# Patient Record
Sex: Female | Born: 1970 | Race: White | Marital: Married
Health system: Northeastern US, Community
[De-identification: ages and names within clinical notes are randomized; demographics above are authoritative.]

---

## 2004-04-09 ENCOUNTER — Ambulatory Visit (HOSPITAL_BASED_OUTPATIENT_CLINIC_OR_DEPARTMENT_OTHER): Payer: MEDICAID

## 2004-04-09 VITALS — BP 120/70 | HR 68 | Temp 97.1°F | Resp 14 | Ht 66.0 in | Wt 176.0 lb

## 2004-04-09 DIAGNOSIS — R51 Headache: Secondary | ICD-10-CM

## 2004-04-09 DIAGNOSIS — R5383 Other fatigue: Secondary | ICD-10-CM

## 2004-04-09 DIAGNOSIS — R5381 Other malaise: Secondary | ICD-10-CM

## 2004-04-09 LAB — COMPREHENSIVE METABOLIC PANEL
ALANINE AMINOTRANSFERASE: 18 IU/L (ref 7–35)
ALBUMIN: 4.1 g/dl (ref 3.4–4.8)
ALKALINE PHOSPHATASE: 53 IU/L (ref 25–106)
ASPARTATE AMINOTRANSFERASE: 22 IU/L (ref 8–34)
BILIRUBIN TOTAL: 0.3 mg/dl (ref 0.2–1.1)
BUN (UREA NITROGEN): 12 mg/dl (ref 6–20)
CALCIUM: 9.4 mg/dl (ref 8.6–10.0)
CARBON DIOXIDE: 29 mmol/L (ref 22–32)
CHLORIDE: 104 mmol/L (ref 101–111)
CREATININE: 0.8 mg/dl (ref 0.4–1.2)
Glucose Random: 90 mg/dl (ref 74–160)
POTASSIUM: 4 mmol/L (ref 3.5–5.1)
SODIUM: 138 mmol/L (ref 135–144)
TOTAL PROTEIN: 6.7 g/dl (ref 5.9–7.5)

## 2004-04-09 LAB — BLOOD COUNT COMPLETE AUTO&AUTO DIFRNTL WBC
BASOPHIL %: 0.4 % (ref 0–2)
EOSINOPHIL %: 10.1 % (ref 0–7)
HEMATOCRIT: 38.2 % (ref 37.0–47.0)
HEMOGLOBIN: 12.6 g/dL (ref 12.0–16.0)
LYMPHOCYTE %: 33.7 % (ref 12–39)
MEAN CORP HGB CONC: 33.1 g/dL (ref 32.0–36.0)
MEAN CORPUSCULAR HGB: 29.1 pg (ref 27.0–31.0)
MEAN CORPUSCULAR VOL: 87.9 fL (ref 81.0–99.0)
MEAN PLATELET VOLUME: 7.4 fL (ref 6.4–10.8)
MONOCYTE %: 8.4 % (ref 1–12)
NEUTROPHIL %: 47.4 % (ref 46–79)
PLATELET COUNT: 415 10*3/uL — ABNORMAL HIGH (ref 150–400)
RBC DISTRIBUTION WIDTH: 13.2 % (ref 11.5–14.3)
RED BLOOD CELL COUNT: 4.34 MIL/uL (ref 4.20–5.40)
WHITE BLOOD CELL COUNT: 9.2 10*3/uL (ref 4.8–10.8)

## 2004-04-09 LAB — TSH (THYROID STIMULATING HORMONE): TSH (THYROID STIM HORMONE): 1.05 u[IU]/mL (ref 0.34–5.60)

## 2004-04-09 NOTE — Progress Notes (Signed)
SUBJECTIVE:  Alexandra Hanson is a 34 year old female who complains of headaches for 4 WEEKS Description of pain: sharp pain. Duration of individual headaches: , frequency rare. Associated symptoms: no nausea, emesis, photophobia or aura. Pain relief: unable to obtain relief with OTC meds. Precipitating factors:LACK OF GLASSES. She denies a history of recent head injury.   Prior neurological history: negative for migraine headaches.  Neurologic Review of Systems - no TIA or stroke-like symptoms.    No current outpatient prescriptions on file.      OBJECTIVE:  Appearance: healthy, alert and cooperative.  Neurological Exam: normal without focal findings, mental status, speech normal, alert and oriented x iii, PERLA, reflexes normal and symmetric.    ASSESSMENT:  unlikely to have organic CNS lesion from H&P.    PLAN:  784.0 HEADACHE (primary encounter diagnosi  Plan: REFERRAL TO OPHTHALMOLOGY (INT)       780.79 MALAISE AND FATIGUE N  Plan: COMPLETE CBC W/AUTO DIFF WBC, COMPREHENSIVE    METABOLIC PANEL, THYROID STIM HORMONE

## 2004-04-15 ENCOUNTER — Ambulatory Visit (HOSPITAL_BASED_OUTPATIENT_CLINIC_OR_DEPARTMENT_OTHER): Payer: Self-pay | Admitting: Ophthalmology

## 2005-02-25 ENCOUNTER — Ambulatory Visit (HOSPITAL_BASED_OUTPATIENT_CLINIC_OR_DEPARTMENT_OTHER): Payer: Medicaid Other

## 2005-02-25 VITALS — BP 100/70 | Temp 98.5°F | Wt 165.0 lb

## 2005-02-25 DIAGNOSIS — J019 Acute sinusitis, unspecified: Secondary | ICD-10-CM

## 2005-02-25 DIAGNOSIS — M069 Rheumatoid arthritis, unspecified: Secondary | ICD-10-CM | POA: Insufficient documentation

## 2005-02-25 DIAGNOSIS — N979 Female infertility, unspecified: Secondary | ICD-10-CM

## 2005-02-25 LAB — URINALYSIS
BILIRUBIN, URINE: NEGATIVE
GLUCOSE, URINE: NEGATIVE MG/DL
KETONE, URINE: NEGATIVE MG/DL
LEUKOCYTE ESTERASE: NEGATIVE
NITRITE, URINE: NEGATIVE
OCCULT BLOOD, URINE: NEGATIVE
PH URINE: 6 (ref 5.0–8.0)
PROTEIN, URINE: NEGATIVE MG/DL
SPECIFIC GRAVITY URINE: 1.02 (ref 1.003–1.035)

## 2005-02-25 LAB — BLOOD COUNT COMPLETE AUTO&AUTO DIFRNTL WBC
BASOPHIL %: 0.3 % (ref 0–2)
EOSINOPHIL %: 5.2 % (ref 0–7)
HEMATOCRIT: 37 % (ref 37.0–47.0)
HEMOGLOBIN: 12.8 g/dL (ref 12.0–16.0)
LYMPHOCYTE %: 30.2 % (ref 12–39)
MEAN CORP HGB CONC: 34.7 g/dL (ref 32.0–36.0)
MEAN CORPUSCULAR HGB: 30.2 pg (ref 27.0–31.0)
MEAN CORPUSCULAR VOL: 87.1 fL (ref 81.0–99.0)
MEAN PLATELET VOLUME: 7.5 fL (ref 6.4–10.8)
MONOCYTE %: 7.2 % (ref 1–12)
NEUTROPHIL %: 57.1 % (ref 46–79)
PLATELET COUNT: 391 10*3/uL (ref 150–400)
RBC DISTRIBUTION WIDTH: 13.4 % (ref 11.5–14.3)
RED BLOOD CELL COUNT: 4.24 MIL/uL (ref 4.20–5.40)
WHITE BLOOD CELL COUNT: 9.4 10*3/uL (ref 4.8–10.8)

## 2005-02-25 LAB — CHG SEDIMENTATION RATE RBC NON-AUTOMATED: RBC SEDIMENTATION RATE: 7 MM/HR (ref 0–15)

## 2005-02-25 MED ORDER — BACTRIM DS 800-160 MG PO TABS
ORAL_TABLET | ORAL | Status: AC
Start: 2005-02-25 — End: 2005-02-28

## 2005-02-25 MED ORDER — FLONASE 50 MCG/DOSE NA INHA
NASAL | Status: AC
Start: 2005-02-25 — End: 2005-03-28

## 2005-02-25 NOTE — Progress Notes (Signed)
SUBJECTIVE:  Alexandra Hanson is a 35 year old female who complains of headaches for 1 months. Description of pain: throbbing pain bilateral in maxillary area. Duration of individual headaches: frequency intermittent. Associated symptoms: no nausea, emesis, photophobia or aura. Pain relief: acetaminophen. Precipitating factors: patient is aware of none. She denies a history of recent head injury.   Prior neurological history: hx HA as child.  Neurologic Review of Systems - no TIA or stroke-like symptoms.  She broke her glasses and has not bought new ones.    No current outpatient prescriptions on file.    Pt also states hx Rheumatoid Arthritis- treated in [place] with prednisone for 6 months, pain is recurring recently- primarily in right wrist and right foot    OBJECTIVE:  Appearance: healthy, alert and cooperative.  HEENT- PERRLA; TM gray and translucent bilateral; throat mild erythema without exudate; lymph neg; thyroid nml size, no nodules  Heart RRR, S1S2, no murmurs  Lungs CTA, no wheezes or rhonchi    Neurological Exam: normal without focal findings, mental status, speech normal, alert and oriented x iii, PERLA, reflexes normal and symmetric.    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

## 2005-02-28 ENCOUNTER — Encounter (HOSPITAL_BASED_OUTPATIENT_CLINIC_OR_DEPARTMENT_OTHER): Payer: Medicaid Other

## 2005-02-28 LAB — CHG RHEUMATOID FACTOR QUALITATIVE: RHEUMATOID FACTOR: NEGATIVE

## 2005-04-21 ENCOUNTER — Ambulatory Visit (HOSPITAL_BASED_OUTPATIENT_CLINIC_OR_DEPARTMENT_OTHER): Payer: Medicaid Other | Admitting: Ophthalmology

## 2005-05-09 ENCOUNTER — Ambulatory Visit (HOSPITAL_BASED_OUTPATIENT_CLINIC_OR_DEPARTMENT_OTHER): Payer: Medicaid Other | Admitting: Ophthalmology

## 2005-05-09 DIAGNOSIS — H5210 Myopia, unspecified eye: Secondary | ICD-10-CM

## 2005-05-09 DIAGNOSIS — H31009 Unspecified chorioretinal scars, unspecified eye: Secondary | ICD-10-CM

## 2005-05-09 DIAGNOSIS — H52209 Unspecified astigmatism, unspecified eye: Secondary | ICD-10-CM

## 2005-09-06 ENCOUNTER — Ambulatory Visit (HOSPITAL_BASED_OUTPATIENT_CLINIC_OR_DEPARTMENT_OTHER): Payer: Self-pay

## 2005-09-06 DIAGNOSIS — S61209A Unspecified open wound of unspecified finger without damage to nail, initial encounter: Secondary | ICD-10-CM

## 2005-09-06 NOTE — Nursing Note (Signed)
>>   Alexandra Hanson     09/06/2005   11:37 am  Telephonic portuguese interpreter present for this visit. Pt had 4 stitches in left middle finger at bottom of nail bed. Removed without difficulty. Wound well healed, clean and dry.

## 2006-08-03 ENCOUNTER — Ambulatory Visit (HOSPITAL_BASED_OUTPATIENT_CLINIC_OR_DEPARTMENT_OTHER): Payer: Medicaid Other | Admitting: Ophthalmology

## 2007-08-17 ENCOUNTER — Ambulatory Visit (HOSPITAL_BASED_OUTPATIENT_CLINIC_OR_DEPARTMENT_OTHER): Payer: Medicaid Other | Admitting: Obstetrics & Gynecology

## 2007-08-17 VITALS — BP 100/70 | Wt 177.5 lb

## 2007-08-17 DIAGNOSIS — Z01419 Encounter for gynecological examination (general) (routine) without abnormal findings: Secondary | ICD-10-CM

## 2007-08-17 LAB — URINE PREGNANCY TEST (POINT OF CARE): HCG QUALITATIVE URINE: NEGATIVE

## 2007-08-17 NOTE — Progress Notes (Signed)
37 year old woman here for annual gyn exam. She has not seen her primary care provider for an annual exam in the last year.     Obstetric History    G2P2, 2 c/s and BTL       CC/HPI: routine screening, has abdominal pain for past 2 weeks, no n/v/fever, no dysuria or GI complaints  Better last 3 days      No current outpatient prescriptions on file.  HRT: not applicable  ALLERGIES:  Review of patient's allergies indicates no known allergies.    Menstrual Status:  pre menopausal    LMP: No LMP date recorded. Reason: Unsure of Dates.     Menses: regular every month, lasting 5 days.  Flow: average/moderate  Sexual History:   sexually active, monogomous  female partner  using contraception: Tubal ligation    Past GYN History: negative    Significant OB History: none    MedHx:  denies    Surg Hx, c/s X 2, BTL, abdominoplasty and cholecystectomy      Social History   Marital Status: Married  Spouse Name: N/A    Years of Education: N/A  Number of Children: N/A     Occupational History  None on file     Social History Main Topics   Tobacco Use: Not on file    Alcohol Use: Not on file    Drug Use: Not on file    Sexually Active: Not on file     Other Topics Concern   None     Social History Narrative   None on file                  Fam Hx:  No breast or ovarian Ca, + hx of leukemia    ROS:  Genitourinary: negative  GI; neg  DV:  denies      PHYSICAL EXAM:  Breasts: no masses, skin, nipple or axillary changes  Abdomen: no masses or tenderness, soft, non-tender and abdominoplasty scar    PELVIC:  External Genitalia: normal architecture  Vagina: well rugated and no lesions  Vaginal Discharge: normal appearing  Pelvic supports: normal  Cervix: no lesions  Uterus: anteverted, normal size and non-tender  Adnexa: no masses, nodularity, tenderness  Rectum: deferred      ASSESSMENT:  Normal GYN annual exam    PLAN:  Pap done, Genprobe done, HPV DNA test done, Return in one year or as needed and pelvic US    COUNSELING:  diet and  nutrition, exercise and breast self exam    Sherral Hammers

## 2007-08-20 ENCOUNTER — Encounter (HOSPITAL_BASED_OUTPATIENT_CLINIC_OR_DEPARTMENT_OTHER): Payer: Self-pay | Admitting: Obstetrics & Gynecology

## 2007-08-20 LAB — CHLAMYDIA GC NAAT
GENPROBE CHLAMYDIA: NEGATIVE
GENPROBE GC: NEGATIVE

## 2007-08-21 LAB — CYTOPATH, C/V, THIN LAYER

## 2007-08-22 ENCOUNTER — Ambulatory Visit: Payer: Self-pay | Admitting: Obstetrics & Gynecology

## 2007-08-22 LAB — US TRANSVAGINAL NON-OB

## 2007-08-22 LAB — US 3D RENDERING WO DEDICATED WORKSTATION

## 2007-08-22 LAB — US PELVIC NON-PREGNANT

## 2007-08-24 ENCOUNTER — Telehealth (HOSPITAL_BASED_OUTPATIENT_CLINIC_OR_DEPARTMENT_OTHER): Payer: Self-pay | Admitting: Obstetrics & Gynecology

## 2007-08-24 ENCOUNTER — Encounter (HOSPITAL_BASED_OUTPATIENT_CLINIC_OR_DEPARTMENT_OTHER): Payer: Self-pay | Admitting: Obstetrics & Gynecology

## 2007-08-24 LAB — HUMAN PAPILLOMAVIRUS (HPV): HUMAN PAPILLOMAVIRUS: NEGATIVE

## 2007-08-24 NOTE — Telephone Encounter (Signed)
Due to the language barrier, the phone call was conducted in Portuguese with an interpreter.  The interpreter was on the phone during the entire phone call.    TC to pt Left message asking pt to call [facility] back and ask to speak to a nurse.

## 2007-08-24 NOTE — Telephone Encounter (Signed)
Please inform pt that her US showed a small 2 cm ovarian cyst that is probably resolving, likely reason for pain.  We can repeat US in approx 6 weeks  Thank you  Sherral Hammers

## 2007-08-27 NOTE — Telephone Encounter (Signed)
TC pt via pacific interp - portuguese interp - #[phone]    Informed pt of u/s results from 08/22/07 showing 2 small ovarian cysts and f/u plan for repeat u/s in 6 weeks.  Pt agrees with plan.  Appt given to pt for u/s on 10/03/07.  Pt transferred to front desk to make f/u appt after u/s with Dr.Franco.    Forwarded to Dr.Franco.

## 2007-08-27 NOTE — Telephone Encounter (Signed)
Due to the language barrier, the phone call was conducted in Portuguese with an interpreter.  The interpreter was on the phone during the entire phone call.    TC to pt Left message asking pt to call [facility] back and ask to speak to a nurse.

## 2007-08-27 NOTE — Telephone Encounter (Signed)
Staff Message copied by Ardeen Fillers on Mon Aug 27, 2007 9:14 AM  ------   Message from: Kerney Elbe   Created: Mon Aug 27, 2007 9:04 AM   Regarding: returning call    Alexandra Hanson [id number], 37 year old, female, Telephone Information:  Home Phone [phone]  Work Phone [phone]      Cleotis Lema NUMBER: [phone]  Best time to call back:   Cell phone:   Other phone:    Available times:    Patient's language of care: Tonga    Patient needs a Tong[place] interpreter.    Patient's PCP: None    Person calling on behalf of patient: Patient (self)    Calls today Returning phonecall    Patient's Preferred Pharmacy:   No Pharmacies Listed

## 2007-10-03 ENCOUNTER — Ambulatory Visit: Payer: Self-pay | Admitting: Obstetrics & Gynecology

## 2007-10-03 LAB — US 3D RENDERING WO DEDICATED WORKSTATION

## 2007-10-03 LAB — US TRANSVAGINAL NON-OB

## 2007-10-03 LAB — US PELVIC NON-PREGNANT

## 2007-10-04 ENCOUNTER — Telehealth (HOSPITAL_BASED_OUTPATIENT_CLINIC_OR_DEPARTMENT_OTHER): Payer: Self-pay | Admitting: Obstetrics & Gynecology

## 2007-10-04 NOTE — Telephone Encounter (Signed)
Message left via [place] interpretor to call [facility] and ask for nurse

## 2007-10-04 NOTE — Telephone Encounter (Signed)
Please inform pt that her ovarian cyst has resolved  Thank you  Demetria Lightsey

## 2007-10-05 NOTE — Telephone Encounter (Signed)
Staff Message copied by Ardeen Fillers on Fri Oct 05, 2007 9:33 AM  ------   Message from: Encarnacion Chu   Created: Fri Oct 05, 2007 9:28 AM   Regarding: FW: ret call   Contact: [phone]    Cleotis Lema NUMBER: [phone]  ----- Message -----   From: Encarnacion Chu   Sent: Oct 05, 2007 9:12 AM   To: Daryl Eastern  Subject: ret call     Claud Kelp [id number], 37 year old, female, Telephone Information:  Home Phone [phone]  Work Phone [phone]      Cleotis Lema NUMBER: [phone]  Cell phone:   Other phone:    Available times:    Patient's language of care: Tonga    Patient needs a Tonga interpreter.    Patient's PCP: None    Person calling on behalf of patient: Patient (self)    Calls today returning phone call.

## 2007-10-05 NOTE — Telephone Encounter (Signed)
LM on pt voice mail to call [facility] at [phone] and ask to speak with a nurse

## 2007-10-05 NOTE — Telephone Encounter (Signed)
Per Dr.Franco pt informed that her u/s from 10/03/07 showed that her ovarian cyst has resolved.

## 2007-10-05 NOTE — Telephone Encounter (Signed)
Staff Message copied by Ardeen Fillers on Fri Oct 05, 2007 9:21 AM  ------   Message from: Encarnacion Chu   Created: Fri Oct 05, 2007 9:12 AM   Regarding: ret call   Contact: [phone]    Alexandra Hanson [id number], 37 year old, female, Telephone Information:  Home Phone [phone]  Work Phone [phone]      Cleotis Lema NUMBER: [phone]  Cell phone:   Other phone:    Available times:    Patient's language of care: Tonga    Patient needs a Tonga interpreter.    Patient's PCP: None    Person calling on behalf of patient: Patient (self)    Calls today returning phone call.

## 2007-10-05 NOTE — Telephone Encounter (Signed)
TC pt via [place] portuguese interp     LM on pt voice mail to call [facility] at [phone] and ask to speak with a nurse

## 2007-10-05 NOTE — Telephone Encounter (Signed)
TC pt via Jonny Ruiz portuguese interp at ext [phone]    LM on pt voice mail to call [facility] at [phone] and ask to speak with a nurse

## 2007-10-17 ENCOUNTER — Ambulatory Visit (HOSPITAL_BASED_OUTPATIENT_CLINIC_OR_DEPARTMENT_OTHER): Payer: Medicaid Other | Admitting: Obstetrics & Gynecology

## 2007-10-25 ENCOUNTER — Ambulatory Visit (HOSPITAL_BASED_OUTPATIENT_CLINIC_OR_DEPARTMENT_OTHER): Payer: Medicaid Other | Admitting: Obstetrics & Gynecology

## 2007-10-25 VITALS — BP 100/60 | Wt 176.5 lb

## 2007-10-25 DIAGNOSIS — N83209 Unspecified ovarian cyst, unspecified side: Secondary | ICD-10-CM

## 2007-10-25 NOTE — Progress Notes (Signed)
37 yo female here for follow up US  Had abdominal pain around annual exam, [place] found ovarian cyst 2 cm complex  Had f/u US and resolved  Pt today states pain improved but has some cramping around menses  Has hx of BTL and desires pregnancy  Ref to [place] [facility]    Pt seen with interpreter  15/20 min counseling visit    Sherral Hammers

## 2009-10-07 ENCOUNTER — Ambulatory Visit (HOSPITAL_BASED_OUTPATIENT_CLINIC_OR_DEPARTMENT_OTHER): Payer: Medicaid Other | Admitting: Internal Medicine

## 2009-10-14 NOTE — Progress Notes (Signed)
This encounter was opened in error.  Please disregard.

## 2018-08-20 ENCOUNTER — Other Ambulatory Visit: Payer: Self-pay

## 2018-08-23 ENCOUNTER — Ambulatory Visit: Payer: No Typology Code available for payment source | Admitting: Family Medicine

## 2018-08-23 NOTE — Progress Notes (Signed)
Review of Patient's Allergies indicates:  No Known Allergies    No current outpatient medications on file.     No current facility-administered medications for this visit.      Patient Active Problem List:     Rheumatoid arthritis(714.0)        Last visit [facility] 2011  Lives in [place] in 2012 had twins; then no reg health care  Was last seen [facility]  ED approx 8 mo ago  Says was fainting and weak, told she had a fibroid    HPI:Had irreg menses x2 month; on /off  Says, "she can't deal with this anymore."  [facility] but they explained 2/2 covid, there would be a wait to be seen for her fibroids.  So then pt called [facility] and wanted to be seen today.  I explained this was an appt to establish primary care and future care at [facility] we would try to care for any urgent problems, but also have delays 2/2 covid workflows. However, pt wants to stay closer to where she lives for her med care.  Said she would call [place]  Again.  I enc'd her to go to ED in [place] if feeling weak or needs urgent care, which can fast track her.  She thanked me and said she would do this.    Helane Gunther, APRN, 08/23/2018

## 2022-08-18 IMAGING — CT TORAX SEM CONTRASTE(Adult)
1 of 3 series · 12 of 31 positions shown, 15 images · non-contrast
Comparison: none

[Series 2: vol pulmao · axial · 0.69mm/px · z∈[-1107,-870]mm · 12 of 281 slices shown, 15 images]
[im 22/281  mediastinal]
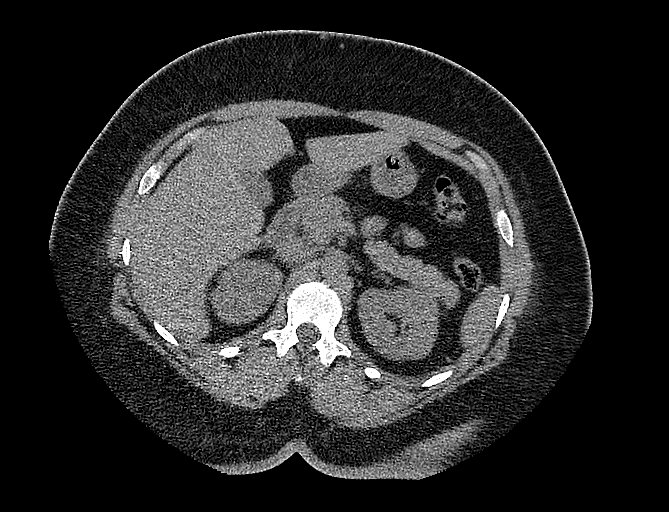
[im 22/281  lung]
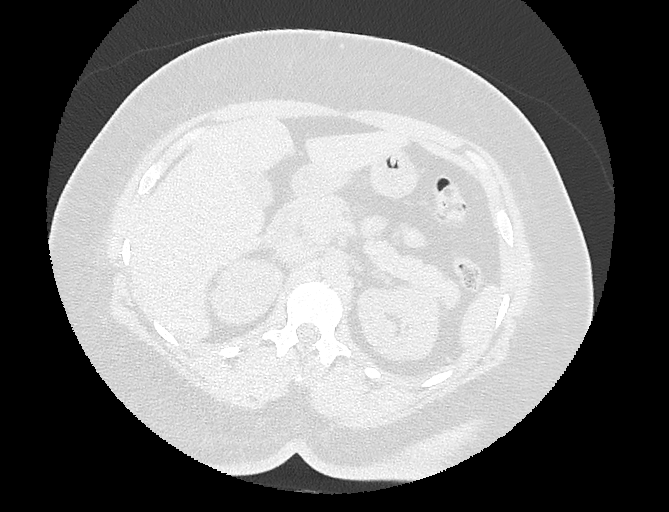
[im 44/281  lung]
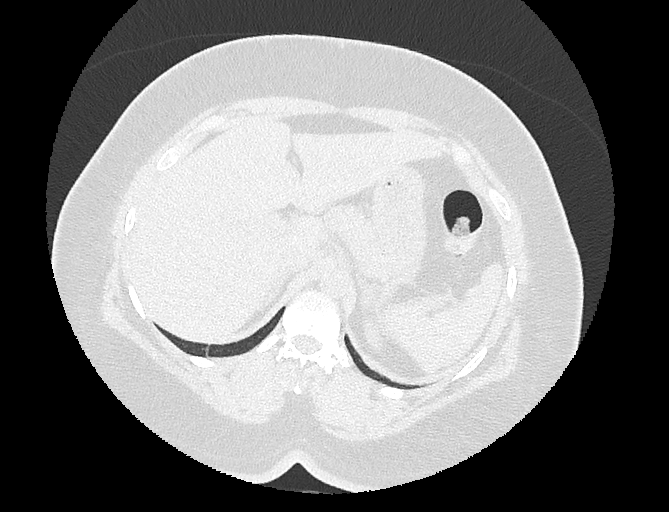
[im 65/281  lung]
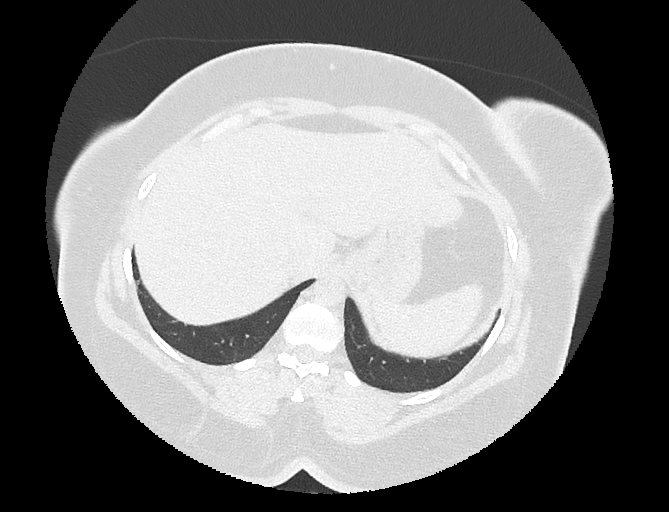
[im 87/281  lung]
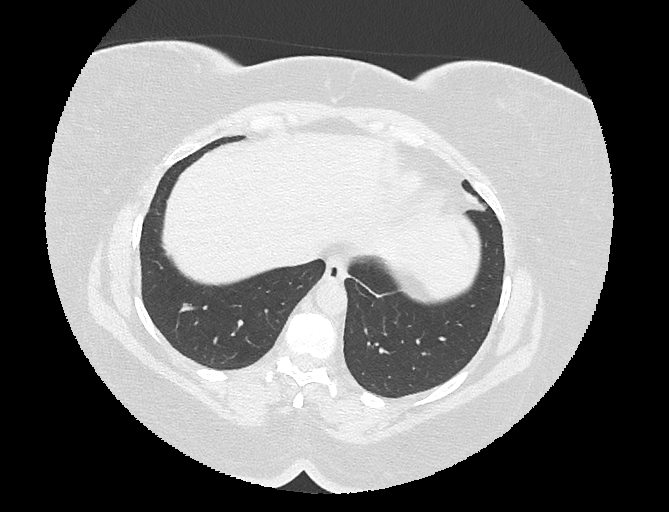
[im 108/281  mediastinal]
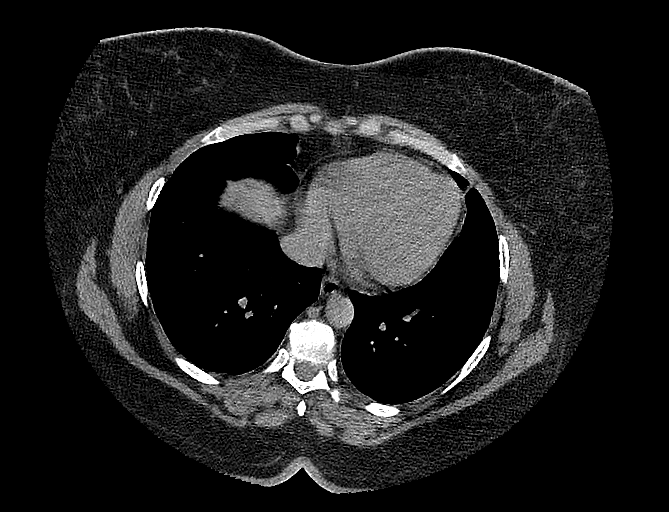
[im 108/281  lung]
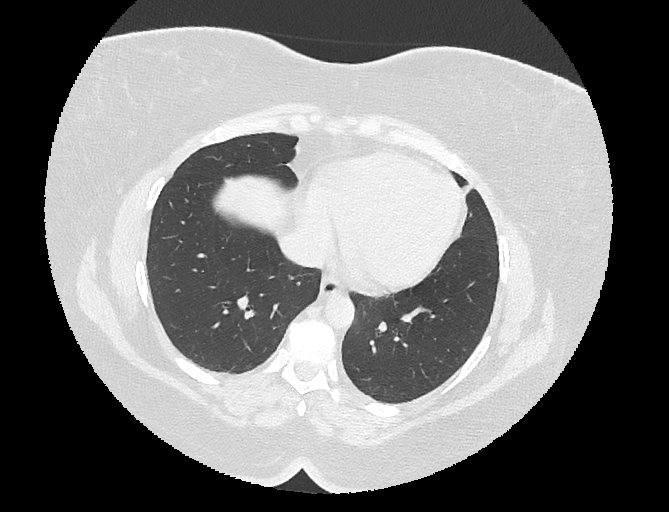
[im 130/281  lung]
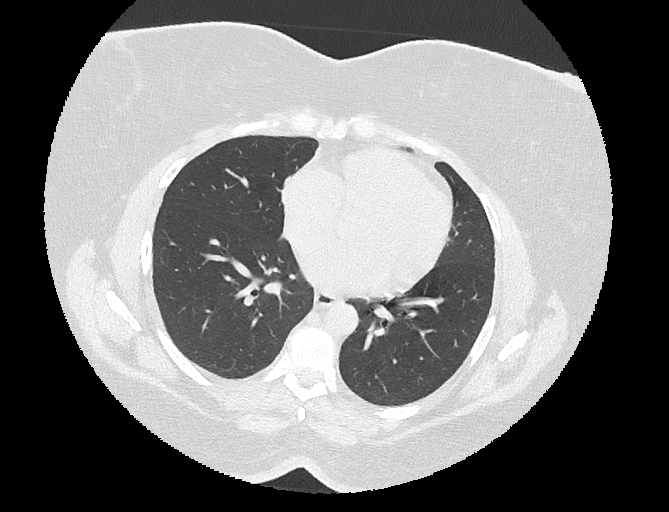
[im 151/281  lung]
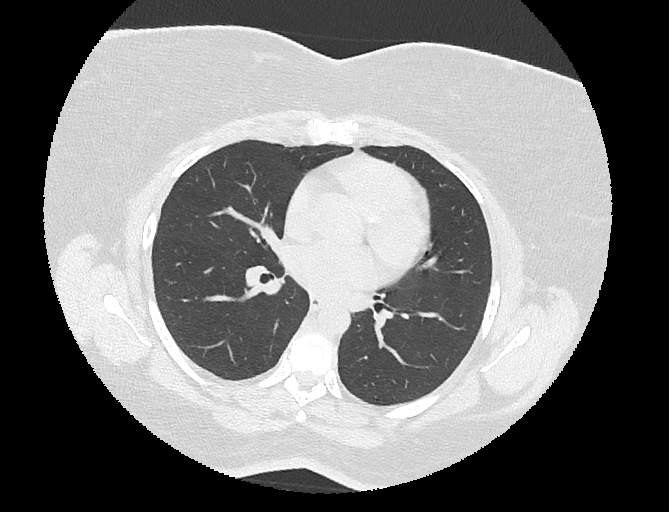
[im 173/281  lung]
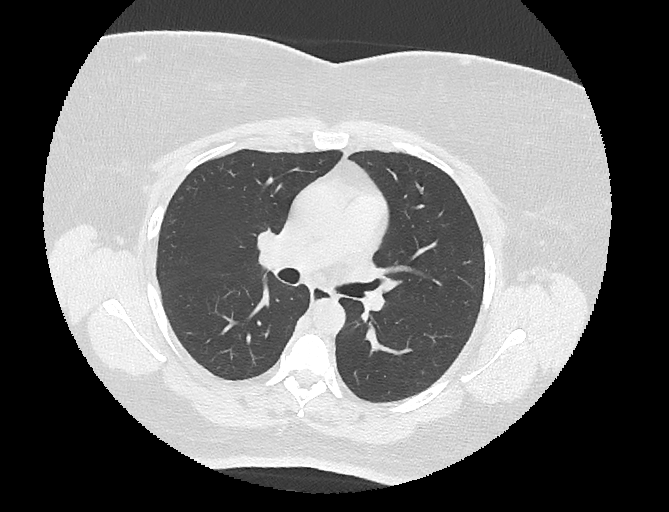
[im 194/281  mediastinal]
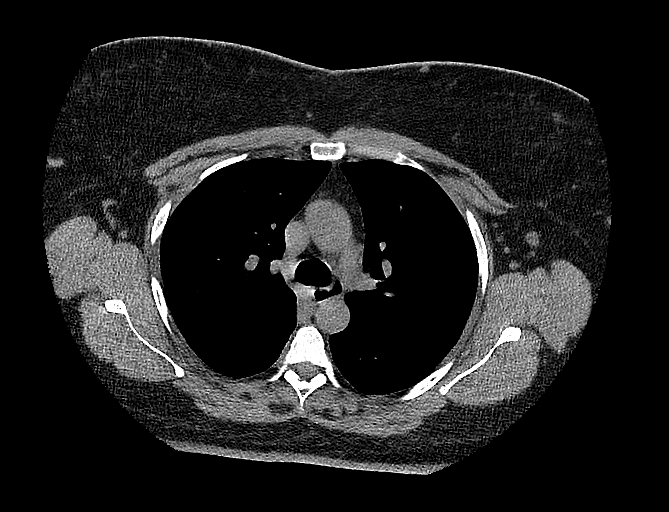
[im 194/281  lung]
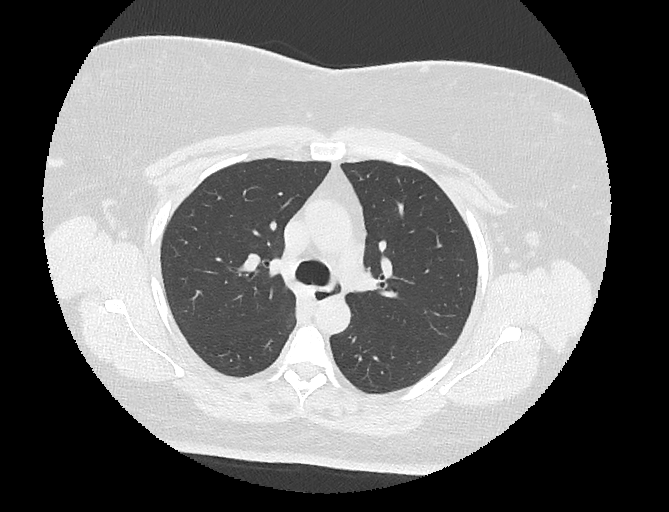
[im 216/281  lung]
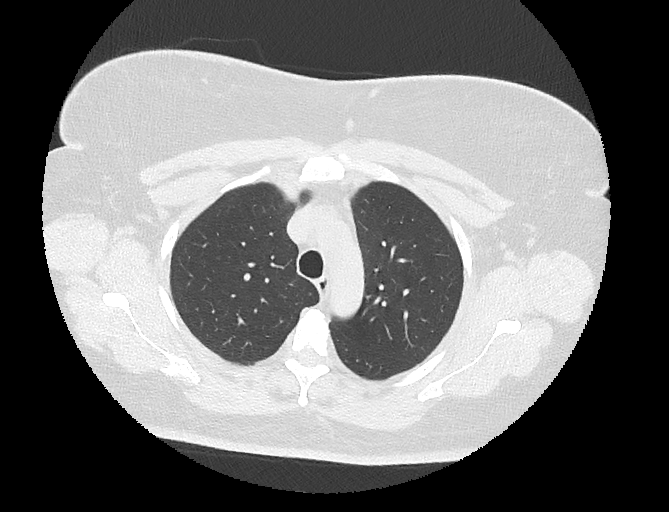
[im 237/281  lung]
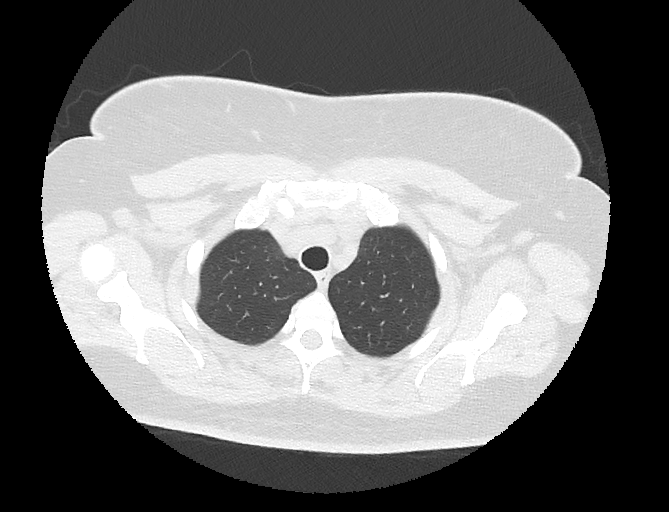
[im 259/281  lung]
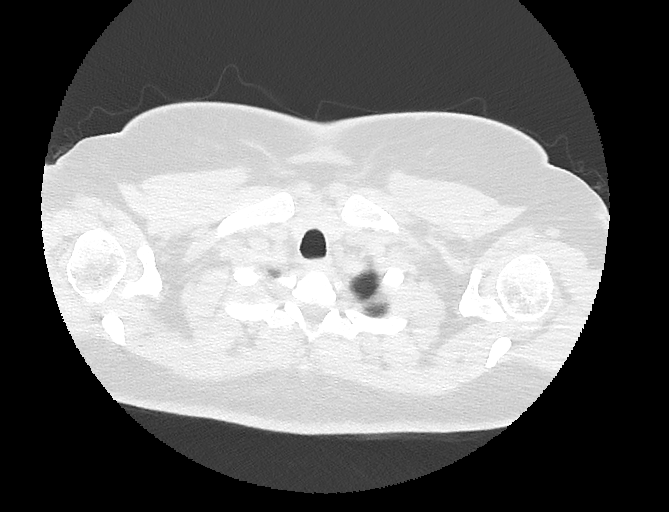

[12 of 31 positions shown; findings below may reference images not displayed]

Técnica:
Aquisições volumétricas com reconstruções multiplanares, sem a injeção endovenosa do meio de contraste
iodado.
Exame realizado em caráter de urgência
TOMOGRAFIA COMPUTADORIZADA DO TÓRAX
Relatório:
Estrias fibroatelectásicas esparsas.
Ausência de derrame pleural.
Traqueia e brônquios principais patentes, de calibres normais.
Estruturas vasculares mediastinais com calibre preservado.
Área cardíaca preservada.
Ausência de linfonodomegalias mediastinais, hilares ou axilares.
Alterações degenerativas na coluna dorsal.
Impressão:
Estrias fibroatelectásicas esparsas.
Alterações degenerativas na coluna dorsal.

## 2023-05-04 ENCOUNTER — Other Ambulatory Visit: Payer: Self-pay

## 2023-10-27 IMAGING — MR [HOSPITAL]^PE
5 series · 40 of 40 positions shown · non-contrast
Comparison: none

[Series 5: T1 · axial · left · 3.0mm · 0.41mm/px · z∈[-89,+15]mm · 11 of 28 slices shown (1 of 2)]
[im 1/28]
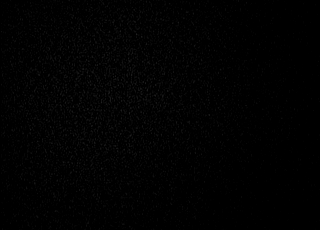
[im 3/28]
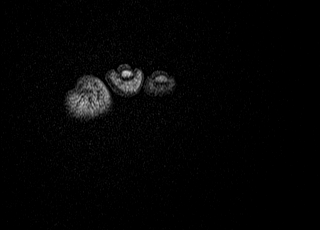
[im 6/28]
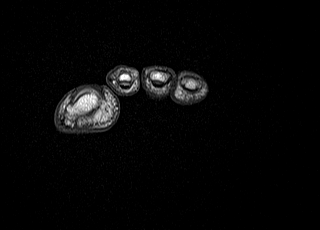
[im 9/28]
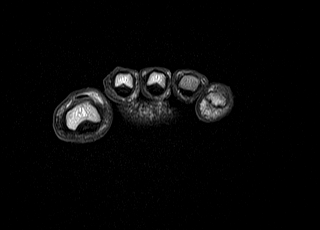
[im 11/28]
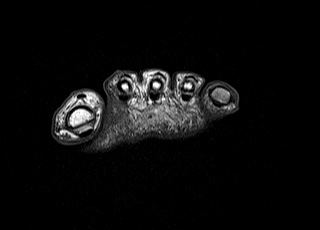
[im 14/28]
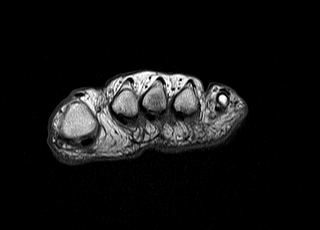
[im 17/28]
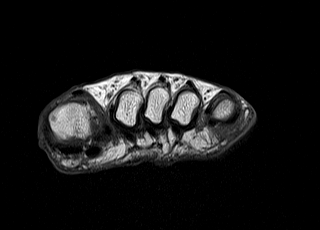
[im 19/28]
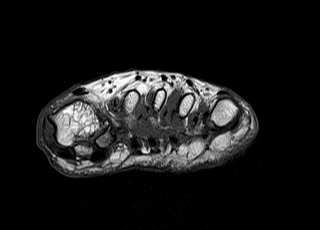
[im 22/28]
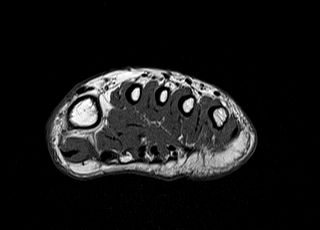
[im 25/28]
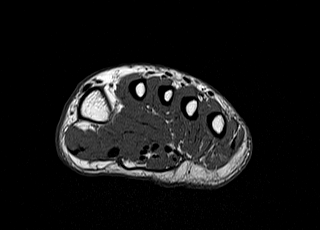
[im 28/28]
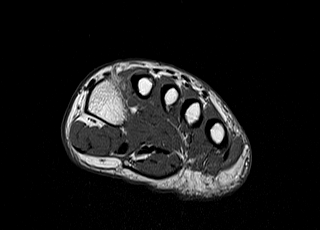

[Series 6: axial dp fat · axial · left · 3.0mm · 0.41mm/px · z∈[-85,+18]mm · 11 of 28 slices shown]
[im 1/28]
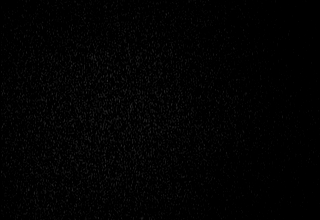
[im 3/28]
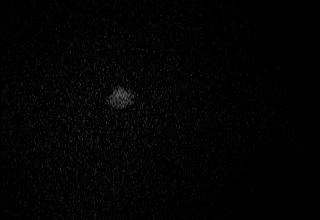
[im 6/28]
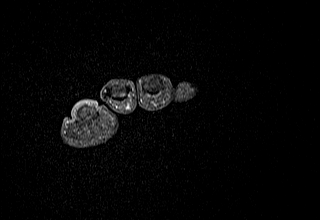
[im 9/28]
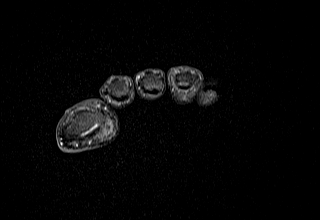
[im 11/28]
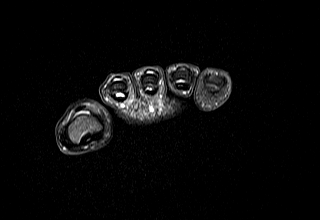
[im 14/28]
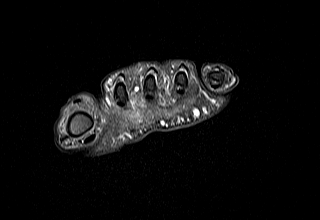
[im 17/28]
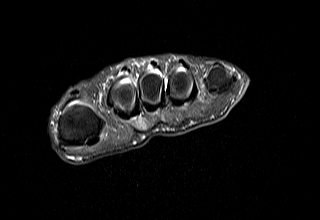
[im 19/28]
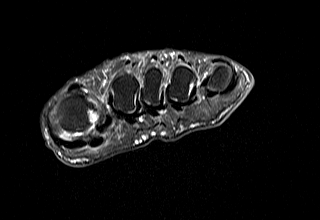
[im 22/28]
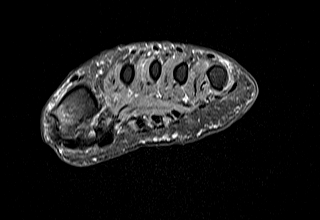
[im 25/28]
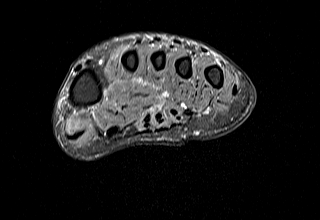
[im 28/28]
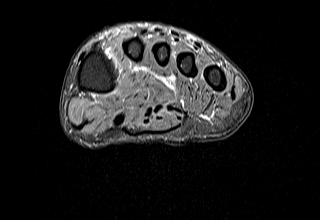

[Series 7: STIR · coronal · left · 3.0mm · 0.62mm/px · 5 of 12 slices shown]
[im 1/12]
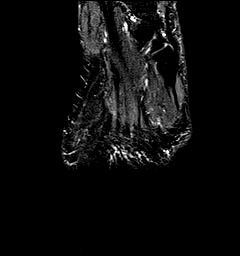
[im 3/12]
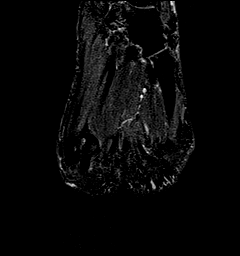
[im 6/12]
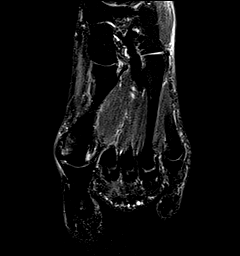
[im 9/12]
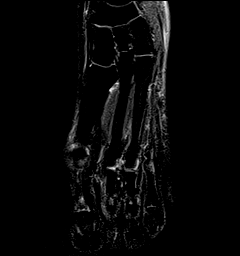
[im 12/12]
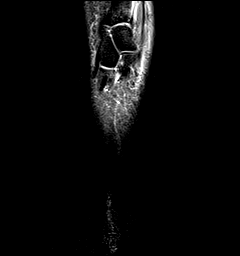

[Series 8: T1 · coronal · left · 3.0mm · 0.42mm/px · 5 of 12 slices shown (2 of 2)]
[im 1/12]
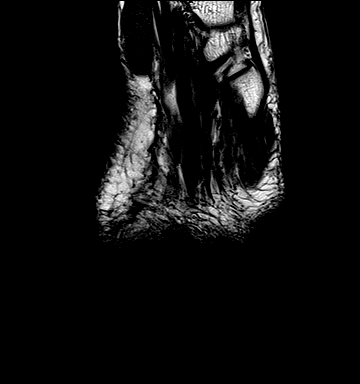
[im 3/12]
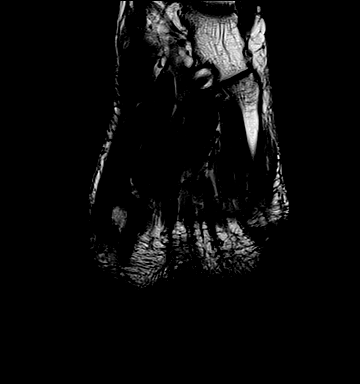
[im 6/12]
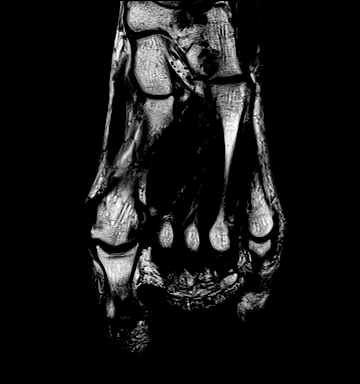
[im 9/12]
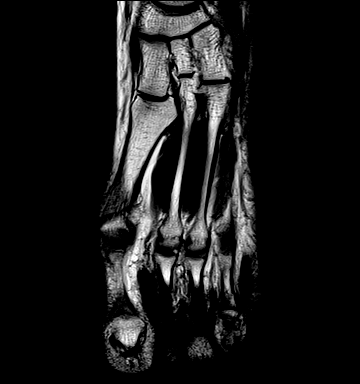
[im 12/12]
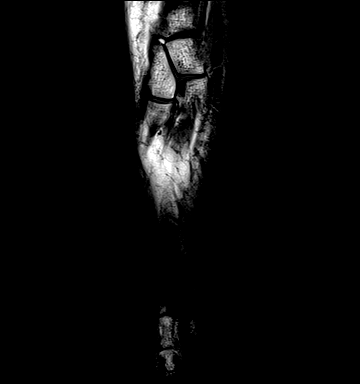

[Series 9: sagital dp fat · sagittal · left · 3.0mm · 0.41mm/px · 8 of 20 slices shown]
[im 1/20]
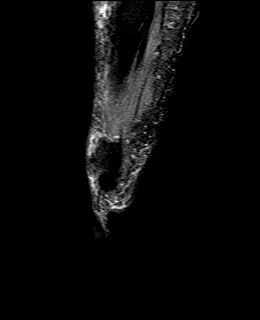
[im 3/20]
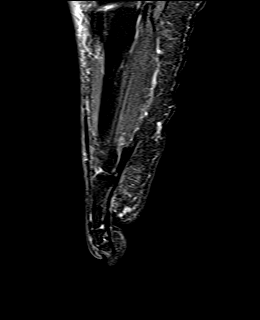
[im 6/20]
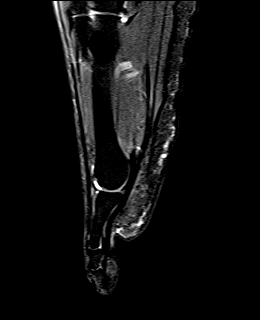
[im 9/20]
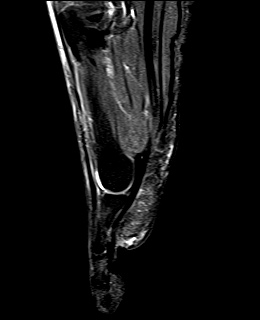
[im 11/20]
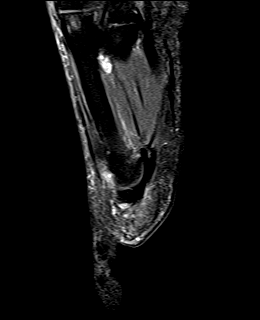
[im 14/20]
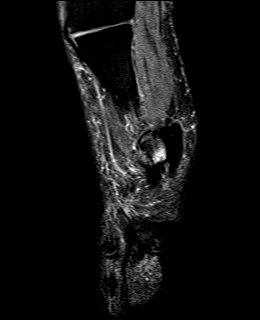
[im 17/20]
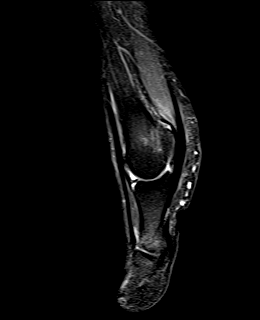
[im 20/20]
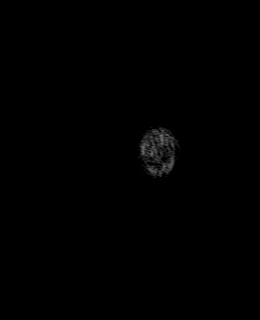

[40 of 40 positions shown; findings below may reference images not displayed]

RESSONÂNCIA MAGNÉTICA DO ANTEPÉ ESQUERDO

Técnica:
Exame  realizado  pela  técnica  fast  spin  echo,  obtendo-se  sequências  ponderadas  em  T1  e  T2,  em  aquisições
multiplanares.

Análise:
Acentuação  do  valgismo  do  hálux,  associada  a  alterações  degenerativas  das  articulações  metatarsofalangeana  e
glenosesamoideas  medial  e  lateral,  caracterizadas  por  aﬁlamento  condral  da  cabeça  metatarsal  e  base  da  falange
proximal com cistos subcorticais e edema ósseo, além de osteóﬁtos marginais.
Pequenas formações nodulares no segundo e terceiro espaços intermetatarsianos distais, medindo cerca de 3,6 mm e
5,5 mm, respectivamente, compatíveis com neuromas interdigitais ([place]orton), com bursite associada.
Obliteração do coxim adiposo plantar sob o ponto de carga das cabeças do primeiro e quinto metatarsos, alteração
relacionada à sobrecarga mecânica.
Demais estruturas ósseas com morfologia e sinal medular conservados.
Ausência de derrame articular.
Tendões ﬂexores e extensores com continuidade, espessura e sinal conservados.
Placas plantares sem lesões.
Estruturas do complexo glenosesamóideo preservadas.
Planos musculares anatômicos, sem evidências de lesão.

Conclusão:
Acentuação  do  valgismo  do  hálux,  associada  a  alterações  degenerativas  das  articulações  metatarsofalangeana  e
glenosesamoideas medial e lateral.
Pequenas formações nodulares no segundo e terceiro espaços intermetatarsianos distais, compatíveis com neuromas
interdigitais (Morton), com bursite associada.
Obliteração do coxim adiposo plantar sob o ponto de carga das cabeças do primeiro e quinto metatarsos, alteração
relacionada à sobrecarga mecânica.

## 2023-10-27 IMAGING — MR [HOSPITAL]^TORNOZELO
6 series · 40 of 40 positions shown · non-contrast
Comparison: none

[Series 3: T1 · sagittal · left · 3.5mm · 0.44mm/px · 5 of 16 slices shown]
[im 1/16]
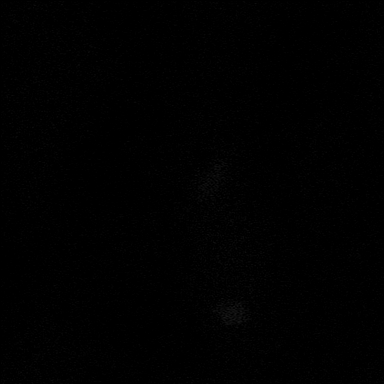
[im 4/16]
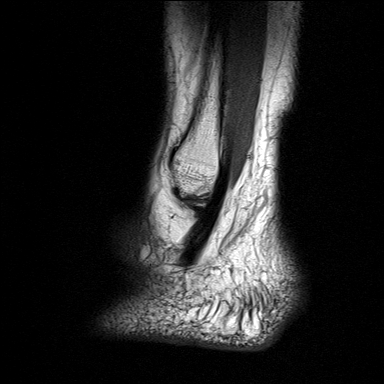
[im 8/16]
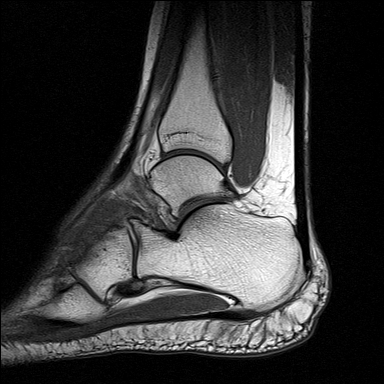
[im 12/16]
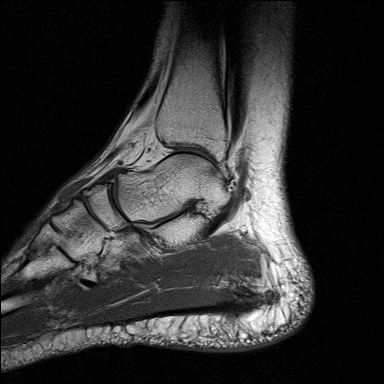
[im 16/16]
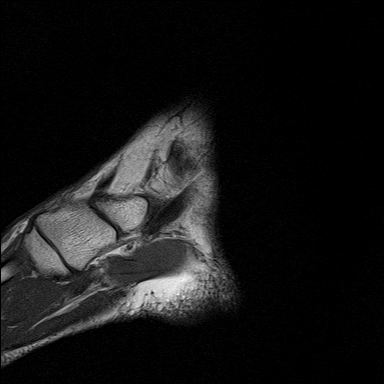

[Series 4: sagital dp fat · sagittal · left · 3.5mm · 0.66mm/px · 5 of 16 slices shown]
[im 1/16]
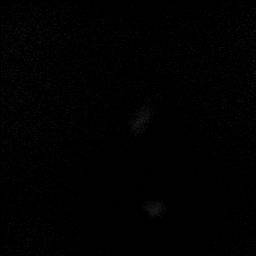
[im 4/16]
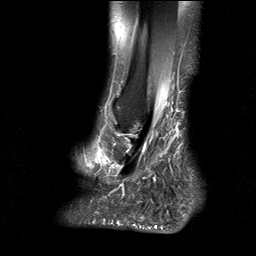
[im 8/16]
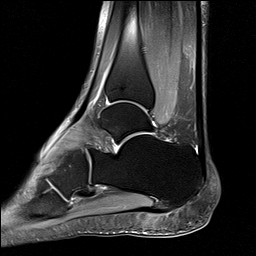
[im 12/16]
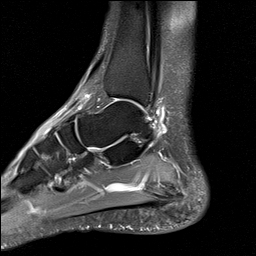
[im 16/16]
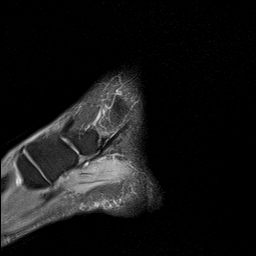

[Series 5: axial dp fat · axial · left · 3.5mm · 0.66mm/px · z∈[-57,+48]mm · 7 of 24 slices shown]
[im 1/24]
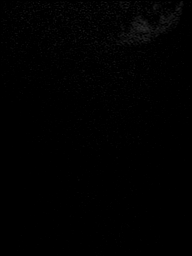
[im 4/24]
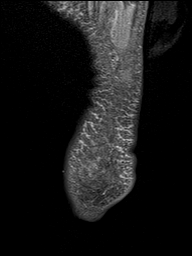
[im 8/24]
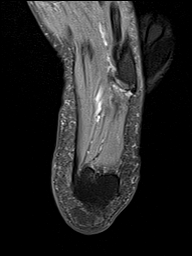
[im 12/24]
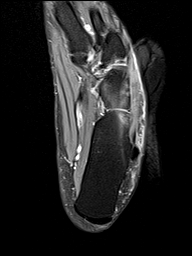
[im 16/24]
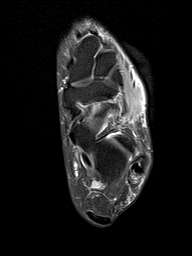
[im 20/24]
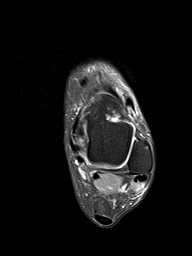
[im 24/24]
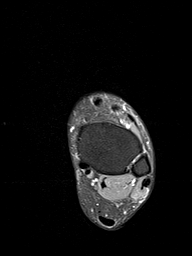

[Series 6: axial dp · axial · left · 3.5mm · 0.66mm/px · z∈[-57,+48]mm · 7 of 24 slices shown]
[im 1/24]
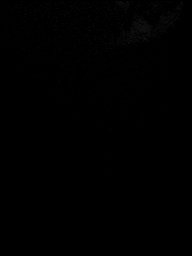
[im 4/24]
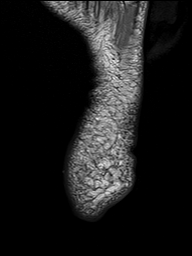
[im 8/24]
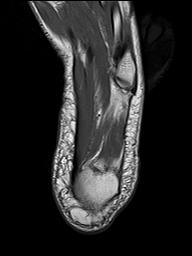
[im 12/24]
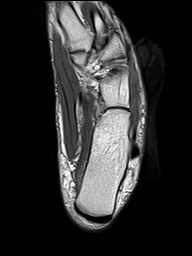
[im 16/24]
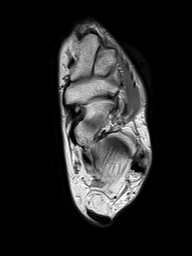
[im 20/24]
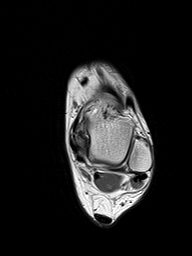
[im 24/24]
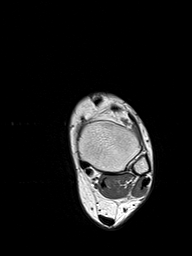

[Series 7: coronal dp fat · coronal · left · 4.0mm · 0.53mm/px · 9 of 28 slices shown (1 of 2)]
[im 1/28]
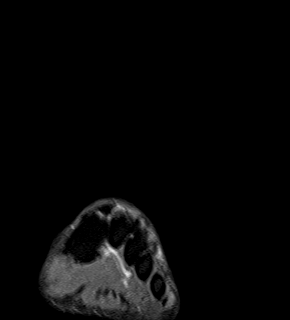
[im 4/28]
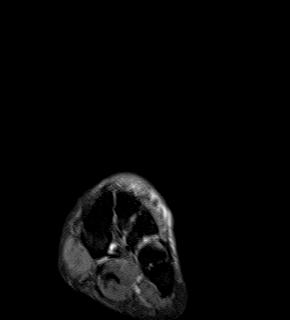
[im 7/28]
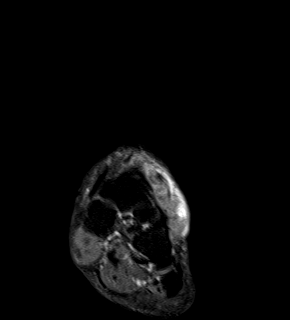
[im 11/28]
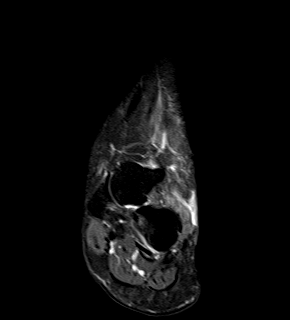
[im 14/28]
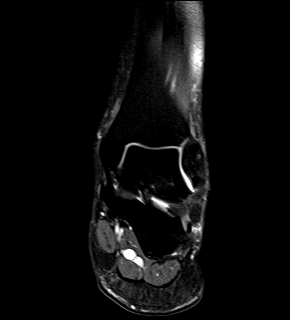
[im 17/28]
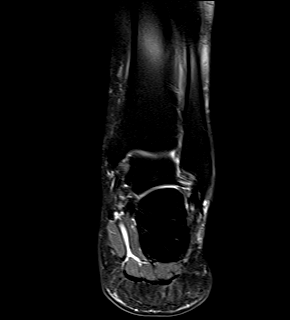
[im 21/28]
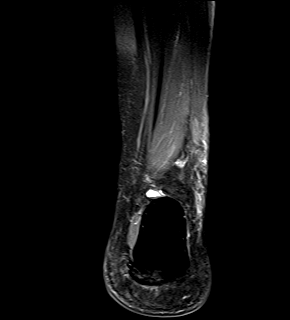
[im 24/28]
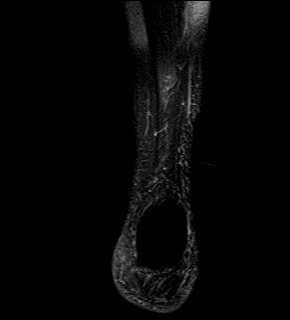
[im 28/28]
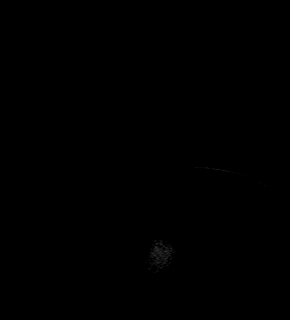

[Series 8: coronal dp fat · axial · left · 3.5mm · 0.53mm/px · z∈[+28,+108]mm · 7 of 22 slices shown (2 of 2)]
[im 1/22]
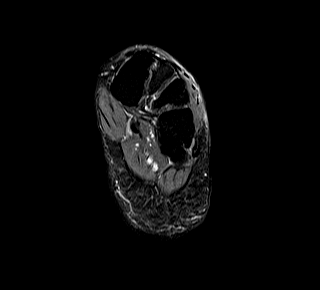
[im 4/22]
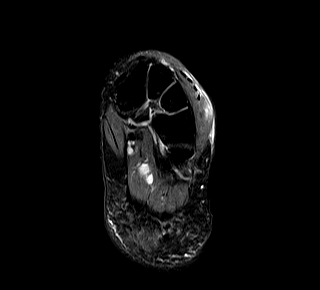
[im 8/22]
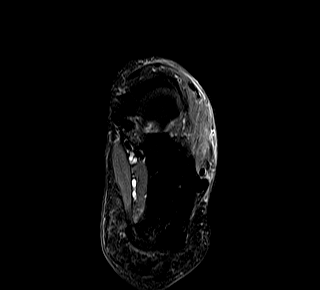
[im 11/22]
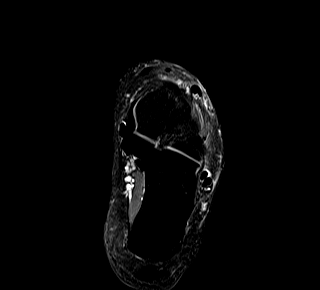
[im 15/22]
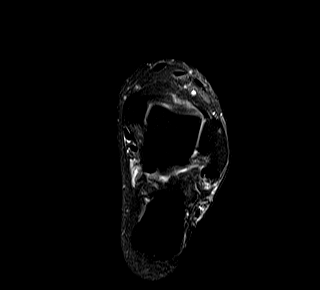
[im 18/22]
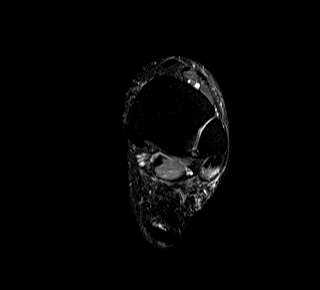
[im 22/22]
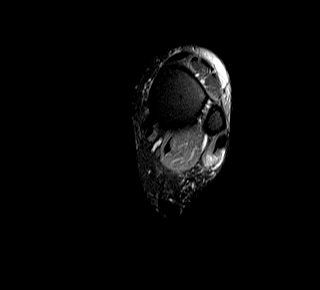

[40 of 40 positions shown; findings below may reference images not displayed]

Técnica:
Exame  realizado  pela  técnica  fast  spin  echo,  obtendo-se  sequências  ponderadas  em  T1  e  T2,  em  aquisições
multiplanares.

RESSONÂNCIA MAGNÉTICA DO TORNOZELO ESQUERDO
Análise:
Esporão plantar medindo cerca de 0,6 cm.
Espessamento e alteração de sinal da origem da banda central da fáscia plantar, com edema ósseo do calcâneo e dos
planos adiposos adjacentes, compatível com fasciíte plantar.
Sinais de rotura parcial dos ligamentos ﬁbulotalar anterior e ﬁbulocalcâneo.
Tendinopatia insercional do calcâneo, com entesóﬁtos medindo até 1,9 mm.
Sinais de tenossinovite dos ﬁbulares.
Demais estruturas ósseas com morfologia e sinal medular conservados.
Superfícies condrais de contornos regulares, sem evidências de lesões osteocondrais.
Ausência de derrame articular.
Sindesmose tibioﬁbular de contornos regulares, estando os ligamentos tibioﬁbulares íntegros.
Tendões tibial posterior, ﬂexores e extensores de continuidade, espessura e sinal conservados.
Seio do tarso livre.
Planos musculares anatômicos, sem evidências de lesão.

Conclusão:
Esporão plantar.
Achados compatíveis com fasciíte plantar.
Sinais de rotura parcial dos ligamentos ﬁbulotalar anterior e ﬁbulocalcâneo.
Tendinopatia insercional do calcâneo.
Sinais de tenossinovite dos ﬁbulares.
